# Patient Record
Sex: Female | Born: 1960 | Race: White | Hispanic: No | Marital: Married | Smoking: Never smoker
Health system: Southern US, Community
[De-identification: ages and names within clinical notes are randomized; demographics above are authoritative.]

## PROBLEM LIST (undated history)

## (undated) HISTORY — PX: NO PAST SURGERIES: SHX2092

## (undated) HISTORY — PX: TONSILECTOMY, ADENOIDECTOMY, BILATERAL MYRINGOTOMY AND TUBES: SHX2538

---

## 1998-02-25 ENCOUNTER — Other Ambulatory Visit: Admission: RE | Admit: 1998-02-25 | Discharge: 1998-02-25 | Payer: Self-pay | Admitting: Gynecology

## 1999-04-27 ENCOUNTER — Other Ambulatory Visit: Admission: RE | Admit: 1999-04-27 | Discharge: 1999-04-27 | Payer: Self-pay | Admitting: Gynecology

## 2001-01-03 ENCOUNTER — Other Ambulatory Visit: Admission: RE | Admit: 2001-01-03 | Discharge: 2001-01-03 | Payer: Self-pay | Admitting: Gynecology

## 2002-04-30 ENCOUNTER — Other Ambulatory Visit: Admission: RE | Admit: 2002-04-30 | Discharge: 2002-04-30 | Payer: Self-pay | Admitting: Gynecology

## 2003-06-20 ENCOUNTER — Other Ambulatory Visit: Admission: RE | Admit: 2003-06-20 | Discharge: 2003-06-20 | Payer: Self-pay | Admitting: Gynecology

## 2011-10-05 ENCOUNTER — Other Ambulatory Visit: Payer: Self-pay | Admitting: Gynecology

## 2011-10-05 DIAGNOSIS — R928 Other abnormal and inconclusive findings on diagnostic imaging of breast: Secondary | ICD-10-CM

## 2011-10-13 ENCOUNTER — Ambulatory Visit
Admission: RE | Admit: 2011-10-13 | Discharge: 2011-10-13 | Disposition: A | Discharge status: Home / Self Care | Payer: Federal, State, Local not specified - PPO | Source: Ambulatory Visit | Attending: Gynecology | Admitting: Gynecology

## 2011-10-13 DIAGNOSIS — R928 Other abnormal and inconclusive findings on diagnostic imaging of breast: Secondary | ICD-10-CM

## 2012-12-06 ENCOUNTER — Other Ambulatory Visit: Payer: Self-pay | Admitting: Gynecology

## 2012-12-06 DIAGNOSIS — N6489 Other specified disorders of breast: Secondary | ICD-10-CM

## 2012-12-19 ENCOUNTER — Ambulatory Visit
Admission: RE | Admit: 2012-12-19 | Discharge: 2012-12-19 | Disposition: A | Discharge status: Home / Self Care | Payer: Federal, State, Local not specified - PPO | Source: Ambulatory Visit | Attending: Gynecology | Admitting: Gynecology

## 2012-12-19 DIAGNOSIS — N6489 Other specified disorders of breast: Secondary | ICD-10-CM

## 2013-12-13 ENCOUNTER — Other Ambulatory Visit: Payer: Self-pay | Admitting: Gynecology

## 2013-12-13 DIAGNOSIS — N6489 Other specified disorders of breast: Secondary | ICD-10-CM

## 2013-12-28 ENCOUNTER — Ambulatory Visit
Admission: RE | Admit: 2013-12-28 | Discharge: 2013-12-28 | Disposition: A | Discharge status: Home / Self Care | Payer: 59 | Source: Ambulatory Visit | Attending: Gynecology | Admitting: Gynecology

## 2013-12-28 DIAGNOSIS — N6489 Other specified disorders of breast: Secondary | ICD-10-CM

## 2015-01-01 ENCOUNTER — Other Ambulatory Visit: Payer: Self-pay

## 2015-01-01 DIAGNOSIS — Z1231 Encounter for screening mammogram for malignant neoplasm of breast: Secondary | ICD-10-CM

## 2015-01-17 ENCOUNTER — Ambulatory Visit
Admission: RE | Admit: 2015-01-17 | Discharge: 2015-01-17 | Disposition: A | Discharge status: Home / Self Care | Payer: 59 | Source: Ambulatory Visit

## 2015-01-17 DIAGNOSIS — Z1231 Encounter for screening mammogram for malignant neoplasm of breast: Secondary | ICD-10-CM

## 2015-12-24 ENCOUNTER — Other Ambulatory Visit: Payer: Self-pay | Admitting: Obstetrics & Gynecology

## 2015-12-24 DIAGNOSIS — Z1231 Encounter for screening mammogram for malignant neoplasm of breast: Secondary | ICD-10-CM

## 2016-01-21 ENCOUNTER — Ambulatory Visit
Admission: RE | Admit: 2016-01-21 | Discharge: 2016-01-21 | Disposition: A | Discharge status: Home / Self Care | Payer: 59 | Source: Ambulatory Visit | Attending: Obstetrics & Gynecology | Admitting: Obstetrics & Gynecology

## 2016-01-21 ENCOUNTER — Encounter: Payer: Self-pay | Admitting: Radiology

## 2016-01-21 DIAGNOSIS — Z1231 Encounter for screening mammogram for malignant neoplasm of breast: Secondary | ICD-10-CM

## 2016-12-10 ENCOUNTER — Other Ambulatory Visit: Payer: Self-pay | Admitting: Obstetrics & Gynecology

## 2016-12-10 DIAGNOSIS — Z1231 Encounter for screening mammogram for malignant neoplasm of breast: Secondary | ICD-10-CM

## 2017-01-25 ENCOUNTER — Ambulatory Visit
Admission: RE | Admit: 2017-01-25 | Discharge: 2017-01-25 | Disposition: A | Discharge status: Home / Self Care | Payer: 59 | Source: Ambulatory Visit | Attending: Obstetrics & Gynecology | Admitting: Obstetrics & Gynecology

## 2017-01-25 DIAGNOSIS — Z1231 Encounter for screening mammogram for malignant neoplasm of breast: Secondary | ICD-10-CM

## 2017-11-30 ENCOUNTER — Other Ambulatory Visit: Payer: Self-pay | Admitting: Obstetrics & Gynecology

## 2017-11-30 DIAGNOSIS — Z1231 Encounter for screening mammogram for malignant neoplasm of breast: Secondary | ICD-10-CM

## 2018-01-31 ENCOUNTER — Ambulatory Visit
Admission: RE | Admit: 2018-01-31 | Discharge: 2018-01-31 | Disposition: A | Discharge status: Home / Self Care | Payer: Federal, State, Local not specified - PPO | Source: Ambulatory Visit | Attending: Obstetrics & Gynecology | Admitting: Obstetrics & Gynecology

## 2018-01-31 DIAGNOSIS — Z1231 Encounter for screening mammogram for malignant neoplasm of breast: Secondary | ICD-10-CM

## 2018-12-27 ENCOUNTER — Other Ambulatory Visit: Payer: Self-pay | Admitting: Obstetrics & Gynecology

## 2018-12-27 DIAGNOSIS — Z1231 Encounter for screening mammogram for malignant neoplasm of breast: Secondary | ICD-10-CM

## 2019-02-07 ENCOUNTER — Ambulatory Visit: Payer: Federal, State, Local not specified - PPO

## 2019-02-15 ENCOUNTER — Other Ambulatory Visit: Payer: Self-pay

## 2019-02-15 ENCOUNTER — Ambulatory Visit
Admission: RE | Admit: 2019-02-15 | Discharge: 2019-02-15 | Disposition: A | Discharge status: Home / Self Care | Payer: Federal, State, Local not specified - PPO | Source: Ambulatory Visit | Attending: Obstetrics & Gynecology | Admitting: Obstetrics & Gynecology

## 2019-02-15 DIAGNOSIS — Z1231 Encounter for screening mammogram for malignant neoplasm of breast: Secondary | ICD-10-CM

## 2019-11-26 DIAGNOSIS — M4125 Other idiopathic scoliosis, thoracolumbar region: Secondary | ICD-10-CM | POA: Diagnosis not present

## 2019-11-26 DIAGNOSIS — M545 Low back pain: Secondary | ICD-10-CM | POA: Diagnosis not present

## 2019-11-26 DIAGNOSIS — M62830 Muscle spasm of back: Secondary | ICD-10-CM | POA: Diagnosis not present

## 2019-11-26 DIAGNOSIS — M9903 Segmental and somatic dysfunction of lumbar region: Secondary | ICD-10-CM | POA: Diagnosis not present

## 2019-11-29 DIAGNOSIS — M62830 Muscle spasm of back: Secondary | ICD-10-CM | POA: Diagnosis not present

## 2019-11-29 DIAGNOSIS — M4125 Other idiopathic scoliosis, thoracolumbar region: Secondary | ICD-10-CM | POA: Diagnosis not present

## 2019-11-29 DIAGNOSIS — M9903 Segmental and somatic dysfunction of lumbar region: Secondary | ICD-10-CM | POA: Diagnosis not present

## 2019-11-29 DIAGNOSIS — M545 Low back pain: Secondary | ICD-10-CM | POA: Diagnosis not present

## 2019-12-03 DIAGNOSIS — M9903 Segmental and somatic dysfunction of lumbar region: Secondary | ICD-10-CM | POA: Diagnosis not present

## 2019-12-03 DIAGNOSIS — M4125 Other idiopathic scoliosis, thoracolumbar region: Secondary | ICD-10-CM | POA: Diagnosis not present

## 2019-12-03 DIAGNOSIS — M545 Low back pain: Secondary | ICD-10-CM | POA: Diagnosis not present

## 2019-12-03 DIAGNOSIS — M62830 Muscle spasm of back: Secondary | ICD-10-CM | POA: Diagnosis not present

## 2019-12-05 DIAGNOSIS — M9903 Segmental and somatic dysfunction of lumbar region: Secondary | ICD-10-CM | POA: Diagnosis not present

## 2019-12-05 DIAGNOSIS — M62830 Muscle spasm of back: Secondary | ICD-10-CM | POA: Diagnosis not present

## 2019-12-05 DIAGNOSIS — M4125 Other idiopathic scoliosis, thoracolumbar region: Secondary | ICD-10-CM | POA: Diagnosis not present

## 2019-12-05 DIAGNOSIS — M545 Low back pain: Secondary | ICD-10-CM | POA: Diagnosis not present

## 2019-12-06 DIAGNOSIS — M545 Low back pain: Secondary | ICD-10-CM | POA: Diagnosis not present

## 2019-12-06 DIAGNOSIS — M4125 Other idiopathic scoliosis, thoracolumbar region: Secondary | ICD-10-CM | POA: Diagnosis not present

## 2019-12-06 DIAGNOSIS — M9903 Segmental and somatic dysfunction of lumbar region: Secondary | ICD-10-CM | POA: Diagnosis not present

## 2019-12-06 DIAGNOSIS — M62830 Muscle spasm of back: Secondary | ICD-10-CM | POA: Diagnosis not present

## 2019-12-10 DIAGNOSIS — M9903 Segmental and somatic dysfunction of lumbar region: Secondary | ICD-10-CM | POA: Diagnosis not present

## 2019-12-10 DIAGNOSIS — M4125 Other idiopathic scoliosis, thoracolumbar region: Secondary | ICD-10-CM | POA: Diagnosis not present

## 2019-12-10 DIAGNOSIS — M545 Low back pain: Secondary | ICD-10-CM | POA: Diagnosis not present

## 2019-12-10 DIAGNOSIS — M62830 Muscle spasm of back: Secondary | ICD-10-CM | POA: Diagnosis not present

## 2019-12-12 DIAGNOSIS — M62830 Muscle spasm of back: Secondary | ICD-10-CM | POA: Diagnosis not present

## 2019-12-12 DIAGNOSIS — M4125 Other idiopathic scoliosis, thoracolumbar region: Secondary | ICD-10-CM | POA: Diagnosis not present

## 2019-12-12 DIAGNOSIS — M9903 Segmental and somatic dysfunction of lumbar region: Secondary | ICD-10-CM | POA: Diagnosis not present

## 2019-12-12 DIAGNOSIS — M545 Low back pain: Secondary | ICD-10-CM | POA: Diagnosis not present

## 2019-12-13 DIAGNOSIS — M9903 Segmental and somatic dysfunction of lumbar region: Secondary | ICD-10-CM | POA: Diagnosis not present

## 2019-12-13 DIAGNOSIS — M545 Low back pain: Secondary | ICD-10-CM | POA: Diagnosis not present

## 2019-12-13 DIAGNOSIS — M4125 Other idiopathic scoliosis, thoracolumbar region: Secondary | ICD-10-CM | POA: Diagnosis not present

## 2019-12-13 DIAGNOSIS — M62830 Muscle spasm of back: Secondary | ICD-10-CM | POA: Diagnosis not present

## 2019-12-17 DIAGNOSIS — M62830 Muscle spasm of back: Secondary | ICD-10-CM | POA: Diagnosis not present

## 2019-12-17 DIAGNOSIS — M4125 Other idiopathic scoliosis, thoracolumbar region: Secondary | ICD-10-CM | POA: Diagnosis not present

## 2019-12-17 DIAGNOSIS — M9903 Segmental and somatic dysfunction of lumbar region: Secondary | ICD-10-CM | POA: Diagnosis not present

## 2019-12-17 DIAGNOSIS — M545 Low back pain: Secondary | ICD-10-CM | POA: Diagnosis not present

## 2019-12-19 DIAGNOSIS — M9903 Segmental and somatic dysfunction of lumbar region: Secondary | ICD-10-CM | POA: Diagnosis not present

## 2019-12-19 DIAGNOSIS — M62830 Muscle spasm of back: Secondary | ICD-10-CM | POA: Diagnosis not present

## 2019-12-19 DIAGNOSIS — M4125 Other idiopathic scoliosis, thoracolumbar region: Secondary | ICD-10-CM | POA: Diagnosis not present

## 2019-12-19 DIAGNOSIS — M545 Low back pain: Secondary | ICD-10-CM | POA: Diagnosis not present

## 2019-12-24 DIAGNOSIS — M4125 Other idiopathic scoliosis, thoracolumbar region: Secondary | ICD-10-CM | POA: Diagnosis not present

## 2019-12-24 DIAGNOSIS — M545 Low back pain: Secondary | ICD-10-CM | POA: Diagnosis not present

## 2019-12-24 DIAGNOSIS — M62830 Muscle spasm of back: Secondary | ICD-10-CM | POA: Diagnosis not present

## 2019-12-24 DIAGNOSIS — M9903 Segmental and somatic dysfunction of lumbar region: Secondary | ICD-10-CM | POA: Diagnosis not present

## 2019-12-26 DIAGNOSIS — M9903 Segmental and somatic dysfunction of lumbar region: Secondary | ICD-10-CM | POA: Diagnosis not present

## 2019-12-26 DIAGNOSIS — M62830 Muscle spasm of back: Secondary | ICD-10-CM | POA: Diagnosis not present

## 2019-12-26 DIAGNOSIS — M545 Low back pain: Secondary | ICD-10-CM | POA: Diagnosis not present

## 2019-12-26 DIAGNOSIS — M4125 Other idiopathic scoliosis, thoracolumbar region: Secondary | ICD-10-CM | POA: Diagnosis not present

## 2019-12-31 ENCOUNTER — Other Ambulatory Visit: Payer: Self-pay | Admitting: Obstetrics & Gynecology

## 2019-12-31 DIAGNOSIS — Z1231 Encounter for screening mammogram for malignant neoplasm of breast: Secondary | ICD-10-CM

## 2020-01-02 DIAGNOSIS — M4125 Other idiopathic scoliosis, thoracolumbar region: Secondary | ICD-10-CM | POA: Diagnosis not present

## 2020-01-02 DIAGNOSIS — M62830 Muscle spasm of back: Secondary | ICD-10-CM | POA: Diagnosis not present

## 2020-01-02 DIAGNOSIS — M545 Low back pain: Secondary | ICD-10-CM | POA: Diagnosis not present

## 2020-01-02 DIAGNOSIS — M9903 Segmental and somatic dysfunction of lumbar region: Secondary | ICD-10-CM | POA: Diagnosis not present

## 2020-01-10 DIAGNOSIS — M62830 Muscle spasm of back: Secondary | ICD-10-CM | POA: Diagnosis not present

## 2020-01-10 DIAGNOSIS — M4125 Other idiopathic scoliosis, thoracolumbar region: Secondary | ICD-10-CM | POA: Diagnosis not present

## 2020-01-10 DIAGNOSIS — M545 Low back pain: Secondary | ICD-10-CM | POA: Diagnosis not present

## 2020-01-10 DIAGNOSIS — M9903 Segmental and somatic dysfunction of lumbar region: Secondary | ICD-10-CM | POA: Diagnosis not present

## 2020-01-23 DIAGNOSIS — M4125 Other idiopathic scoliosis, thoracolumbar region: Secondary | ICD-10-CM | POA: Diagnosis not present

## 2020-01-23 DIAGNOSIS — M62830 Muscle spasm of back: Secondary | ICD-10-CM | POA: Diagnosis not present

## 2020-01-23 DIAGNOSIS — M9903 Segmental and somatic dysfunction of lumbar region: Secondary | ICD-10-CM | POA: Diagnosis not present

## 2020-01-23 DIAGNOSIS — M545 Low back pain: Secondary | ICD-10-CM | POA: Diagnosis not present

## 2020-02-06 DIAGNOSIS — M62830 Muscle spasm of back: Secondary | ICD-10-CM | POA: Diagnosis not present

## 2020-02-06 DIAGNOSIS — M545 Low back pain: Secondary | ICD-10-CM | POA: Diagnosis not present

## 2020-02-06 DIAGNOSIS — M9903 Segmental and somatic dysfunction of lumbar region: Secondary | ICD-10-CM | POA: Diagnosis not present

## 2020-02-06 DIAGNOSIS — M4125 Other idiopathic scoliosis, thoracolumbar region: Secondary | ICD-10-CM | POA: Diagnosis not present

## 2020-02-18 ENCOUNTER — Ambulatory Visit
Admission: RE | Admit: 2020-02-18 | Discharge: 2020-02-18 | Disposition: A | Discharge status: Home / Self Care | Payer: Federal, State, Local not specified - PPO | Source: Ambulatory Visit | Attending: Obstetrics & Gynecology | Admitting: Obstetrics & Gynecology

## 2020-02-18 ENCOUNTER — Other Ambulatory Visit: Payer: Self-pay

## 2020-02-18 DIAGNOSIS — Z01419 Encounter for gynecological examination (general) (routine) without abnormal findings: Secondary | ICD-10-CM | POA: Diagnosis not present

## 2020-02-18 DIAGNOSIS — Z0000 Encounter for general adult medical examination without abnormal findings: Secondary | ICD-10-CM | POA: Diagnosis not present

## 2020-02-18 DIAGNOSIS — Z1231 Encounter for screening mammogram for malignant neoplasm of breast: Secondary | ICD-10-CM

## 2020-02-18 DIAGNOSIS — Z6825 Body mass index (BMI) 25.0-25.9, adult: Secondary | ICD-10-CM | POA: Diagnosis not present

## 2020-02-18 DIAGNOSIS — Z124 Encounter for screening for malignant neoplasm of cervix: Secondary | ICD-10-CM | POA: Diagnosis not present

## 2020-02-27 DIAGNOSIS — M9903 Segmental and somatic dysfunction of lumbar region: Secondary | ICD-10-CM | POA: Diagnosis not present

## 2020-02-27 DIAGNOSIS — M62830 Muscle spasm of back: Secondary | ICD-10-CM | POA: Diagnosis not present

## 2020-02-27 DIAGNOSIS — M4125 Other idiopathic scoliosis, thoracolumbar region: Secondary | ICD-10-CM | POA: Diagnosis not present

## 2020-02-27 DIAGNOSIS — M5136 Other intervertebral disc degeneration, lumbar region: Secondary | ICD-10-CM | POA: Diagnosis not present

## 2020-04-02 DIAGNOSIS — M9903 Segmental and somatic dysfunction of lumbar region: Secondary | ICD-10-CM | POA: Diagnosis not present

## 2020-04-02 DIAGNOSIS — M5136 Other intervertebral disc degeneration, lumbar region: Secondary | ICD-10-CM | POA: Diagnosis not present

## 2020-04-02 DIAGNOSIS — M62830 Muscle spasm of back: Secondary | ICD-10-CM | POA: Diagnosis not present

## 2020-04-02 DIAGNOSIS — M4125 Other idiopathic scoliosis, thoracolumbar region: Secondary | ICD-10-CM | POA: Diagnosis not present

## 2020-04-30 DIAGNOSIS — M9905 Segmental and somatic dysfunction of pelvic region: Secondary | ICD-10-CM | POA: Diagnosis not present

## 2020-04-30 DIAGNOSIS — M4125 Other idiopathic scoliosis, thoracolumbar region: Secondary | ICD-10-CM | POA: Diagnosis not present

## 2020-04-30 DIAGNOSIS — M5136 Other intervertebral disc degeneration, lumbar region: Secondary | ICD-10-CM | POA: Diagnosis not present

## 2020-04-30 DIAGNOSIS — M9903 Segmental and somatic dysfunction of lumbar region: Secondary | ICD-10-CM | POA: Diagnosis not present

## 2021-01-12 ENCOUNTER — Other Ambulatory Visit: Payer: Self-pay | Admitting: Obstetrics & Gynecology

## 2021-01-12 DIAGNOSIS — Z1231 Encounter for screening mammogram for malignant neoplasm of breast: Secondary | ICD-10-CM

## 2021-02-23 ENCOUNTER — Other Ambulatory Visit: Payer: Self-pay

## 2021-02-23 ENCOUNTER — Ambulatory Visit
Admission: RE | Admit: 2021-02-23 | Discharge: 2021-02-23 | Disposition: A | Discharge status: Home / Self Care | Payer: Federal, State, Local not specified - PPO | Source: Ambulatory Visit | Attending: Obstetrics & Gynecology | Admitting: Obstetrics & Gynecology

## 2021-02-23 DIAGNOSIS — Z0000 Encounter for general adult medical examination without abnormal findings: Secondary | ICD-10-CM | POA: Diagnosis not present

## 2021-02-23 DIAGNOSIS — Z01419 Encounter for gynecological examination (general) (routine) without abnormal findings: Secondary | ICD-10-CM | POA: Diagnosis not present

## 2021-02-23 DIAGNOSIS — Z1231 Encounter for screening mammogram for malignant neoplasm of breast: Secondary | ICD-10-CM | POA: Diagnosis not present

## 2021-03-31 ENCOUNTER — Encounter: Payer: Self-pay | Admitting: *Deleted

## 2021-03-31 ENCOUNTER — Encounter: Payer: Self-pay | Admitting: Cardiology

## 2021-03-31 DIAGNOSIS — R5383 Other fatigue: Secondary | ICD-10-CM

## 2021-03-31 DIAGNOSIS — E7801 Familial hypercholesterolemia: Secondary | ICD-10-CM | POA: Insufficient documentation

## 2021-03-31 DIAGNOSIS — N951 Menopausal and female climacteric states: Secondary | ICD-10-CM | POA: Insufficient documentation

## 2021-03-31 DIAGNOSIS — R5381 Other malaise: Secondary | ICD-10-CM

## 2021-03-31 DIAGNOSIS — E7800 Pure hypercholesterolemia, unspecified: Secondary | ICD-10-CM

## 2021-03-31 DIAGNOSIS — IMO0002 Reserved for concepts with insufficient information to code with codable children: Secondary | ICD-10-CM | POA: Insufficient documentation

## 2021-03-31 HISTORY — DX: Pure hypercholesterolemia, unspecified: E78.00

## 2021-03-31 HISTORY — DX: Familial hypercholesterolemia: E78.01

## 2021-03-31 HISTORY — DX: Other fatigue: R53.83

## 2021-03-31 HISTORY — DX: Menopausal and female climacteric states: N95.1

## 2021-03-31 HISTORY — DX: Other fatigue: R53.81

## 2021-04-03 ENCOUNTER — Ambulatory Visit (INDEPENDENT_AMBULATORY_CARE_PROVIDER_SITE_OTHER): Payer: Federal, State, Local not specified - PPO

## 2021-04-03 ENCOUNTER — Encounter: Payer: Self-pay | Admitting: Cardiology

## 2021-04-03 ENCOUNTER — Ambulatory Visit: Payer: Federal, State, Local not specified - PPO | Admitting: Cardiology

## 2021-04-03 ENCOUNTER — Other Ambulatory Visit: Payer: Self-pay

## 2021-04-03 VITALS — BP 144/84 | HR 58 | Ht 65.5 in | Wt 160.8 lb

## 2021-04-03 DIAGNOSIS — E785 Hyperlipidemia, unspecified: Secondary | ICD-10-CM

## 2021-04-03 DIAGNOSIS — R002 Palpitations: Secondary | ICD-10-CM | POA: Diagnosis not present

## 2021-04-03 DIAGNOSIS — R0609 Other forms of dyspnea: Secondary | ICD-10-CM | POA: Diagnosis not present

## 2021-04-03 NOTE — Progress Notes (Signed)
Cardiology Consultation:    Date:  04/03/2021   ID:  Melissa Marks, DOB [birth year], MRN [id number]  PCP:  Melissa Helling, MD  Cardiologist:  Melissa Balsam, MD   Referring MD: Melissa Helling, MD   Chief Complaint  Patient presents with   Palpitations    History of Present Illness:    Melissa Marks is a 60 y.o. female who is being seen today for the evaluation of palpitations at the request of Melissa Helling, MD. with past medical history significant for dyslipidemia, dyspnea on exertion.  She came to [place] because of palpitations.  She said for years she will have abrupt onset abrupt offset palpitations that make her feel weak.  Sometimes she had to sit down when she got this palpitations however never passed out because of this.  She does have any chest pain tightness squeezing pressure burning Sensation with this sensation.  There is no shortness of breath.  She said that sensation lasted up to 5 minutes.  She cannot tell me if it is regular or irregular.  She cannot tell me also what triggers those palpitations. Overall she is doing quite well she works as a Research officer, political party she is very active she has to carry things and that she has no difficulty doing it.  Denies having any chest pain tightness squeezing pressure in the chest when he does not but she gets some shortness of breath with exertion. She never smoked Does have family history of multiple family members with premature coronary artery disease I did review her K PN which show me her LDL of 113 HDL 81 this is from October 2021 however. She does not exercise on the regular basis, she is not on any special diet  Past Medical History:  Diagnosis Date   Familial hypercholesterolemia 03/31/2021   Fatigue 03/31/2021   Malaise and fatigue 03/31/2021   Menopausal syndrome 03/31/2021   Pure hypercholesterolemia 03/31/2021    Past Surgical History:  Procedure Laterality Date   TONSILECTOMY, ADENOIDECTOMY, BILATERAL MYRINGOTOMY AND TUBES       Current Medications: Current Meds  Medication Sig   calcium carbonate (TUMS - DOSED IN MG ELEMENTAL CALCIUM) 500 MG chewable tablet Chew 1 tablet by mouth every other day.     Allergies:   Patient has no known allergies.   Social History   Socioeconomic History   Marital status: Married    Spouse name: Not on file   Number of children: Not on file   Years of education: Not on file   Highest education level: Not on file  Occupational History   Not on file  Tobacco Use   Smoking status: Never   Smokeless tobacco: Not on file  Substance and Sexual Activity   Alcohol use: Never   Drug use: Never   Sexual activity: Not on file  Other Topics Concern   Not on file  Social History Narrative   Not on file   Social Determinants of Health   Financial Resource Strain: Not on file  Food Insecurity: Not on file  Transportation Needs: Not on file  Physical Activity: Not on file  Stress: Not on file  Social Connections: Not on file     Family History: The patient's family history includes Aneurysm in her father; Heart disease in her father, maternal grandfather, and mother; Heart failure in her mother; Ovarian cancer in her mother. There is no history of Breast cancer. ROS:   Please see the history of present illness.  All 14 point review of systems negative except as described per history of present illness.  EKGs/Labs/Other Studies Reviewed:    The following studies were reviewed today:   EKG:  EKG is  ordered today.  The ekg ordered today demonstrates normal sinus rhythm normal P interval normal QS complex duration morphology no ST segment changes  Recent Labs: No results found for requested labs within last 8760 hours.  Recent Lipid Panel No results found for: CHOL, TRIG, HDL, CHOLHDL, VLDL, LDLCALC, LDLDIRECT  Physical Exam:    VS:  BP (!) 144/84 (BP Location: Left Arm, Patient Position: Sitting)   Pulse (!) 58   Ht 5' 5.5" (1.664 m)   Wt 160 lb 12.8 oz  (72.9 kg)   SpO2 97%   BMI 26.35 kg/m     Wt Readings from Last 3 Encounters:  04/03/21 160 lb 12.8 oz (72.9 kg)  02/23/21 157 lb (71.2 kg)     GEN:  Well nourished, well developed in no acute distress HEENT: Normal NECK: No JVD; No carotid bruits LYMPHATICS: No lymphadenopathy CARDIAC: RRR, no murmurs, no rubs, no gallops RESPIRATORY:  Clear to auscultation without rales, wheezing or rhonchi  ABDOMEN: Soft, non-tender, non-distended MUSCULOSKELETAL:  No edema; No deformity  SKIN: Warm and dry NEUROLOGIC:  Alert and oriented x 3 PSYCHIATRIC:  Normal affect   ASSESSMENT:    1. Palpitations   2. Dyspnea on exertion   3. Dyslipidemia    PLAN:    In order of problems listed above:  Palpitations.  I will ask her to wear Zio patch for 2 weeks to see if she got any significant arrhythmia.  I will not start any antiarrhythmic medication until I know exactly what is going on.  If we will not identified a problem then she needs to purchase either cardia apple watch to see if we can catch arrhythmia.  As a part of evaluation she will have an echocardiogram done to assess left ventricle ejection fraction. Dyspnea on exertion: Echocardiogram will be performed. Dyslipidemia her cholesterol is quite decent.  But this data from a year ago.  We will try to get another copy of her most recent fasting lipid profile.     Medication Adjustments/Labs and Tests Ordered: Current medicines are reviewed at length with the patient today.  Concerns regarding medicines are outlined above.  No orders of the defined types were placed in this encounter.  No orders of the defined types were placed in this encounter.   Signed, Georgeanna Lea, MD, [facility]. 04/03/2021 3:26 PM    [facility]

## 2021-04-03 NOTE — Patient Instructions (Signed)
Medication Instructions:  Your physician recommends that you continue on your current medications as directed. Please refer to the Current Medication list given to you today.  *If you need a refill on your cardiac medications before your next appointment, please call your pharmacy*   Lab Work: None. If you have labs (blood work) drawn today and your tests are completely normal, you will receive your results only by: MyChart Message (if you have MyChart) OR A paper copy in the mail If you have any lab test that is abnormal or we need to change your treatment, we will call you to review the results.   Testing/Procedures: Your physician has requested that you have an echocardiogram. Echocardiography is a painless test that uses sound waves to create images of your heart. It provides your doctor with information about the size and shape of your heart and how well your heart's chambers and valves are working. This procedure takes approximately one hour. There are no restrictions for this procedure.  A zio monitor was ordered today. It will remain on for 14 days. You will then return monitor and event diary in provided box. It takes 1-2 weeks for report to be downloaded and returned to us. We will call you with the results. If monitor falls off or has orange flashing light, please call Zio for further instructions.     Follow-Up: At [facility], you and your health needs are our priority.  As part of our continuing mission to provide you with exceptional heart care, we have created designated Provider Care Teams.  These Care Teams include your primary Cardiologist (physician) and Advanced Practice Providers (APPs -  Physician Assistants and Nurse Practitioners) who all work together to provide you with the care you need, when you need it.  We recommend signing up for the patient portal called "MyChart".  Sign up information is provided on this After Visit Summary.  MyChart is used to connect  with patients for Virtual Visits (Telemedicine).  Patients are able to view lab/test results, encounter notes, upcoming appointments, etc.  Non-urgent messages can be sent to your provider as well.   To learn more about what you can do with MyChart, go to [email or web address].    Your next appointment:   3 month(s)  The format for your next appointment:   In Person  Provider:   Robert Krasowski, MD    Other Instructions  Echocardiogram An echocardiogram is a test that uses sound waves (ultrasound) to produce images of the heart. Images from an echocardiogram can provide important information about: Heart size and shape. The size and thickness and movement of your heart's walls. Heart muscle function and strength. Heart valve function or if you have stenosis. Stenosis is when the heart valves are too narrow. If blood is flowing backward through the heart valves (regurgitation). A tumor or infectious growth around the heart valves. Areas of heart muscle that are not working well because of poor blood flow or injury from a heart attack. Aneurysm detection. An aneurysm is a weak or damaged part of an artery wall. The wall bulges out from the normal force of blood pumping through the body. Tell a health care provider about: Any allergies you have. All medicines you are taking, including vitamins, herbs, eye drops, creams, and over-the-counter medicines. Any blood disorders you have. Any surgeries you have had. Any medical conditions you have. Whether you are pregnant or may be pregnant. What are the risks? Generally, this is a safe test. However,   problems may occur, including an allergic reaction to dye (contrast) that may be used during the test. What happens before the test? No specific preparation is needed. You may eat and drink normally. What happens during the test?  You will take off your clothes from the waist up and put on a hospital gown. Electrodes or  electrocardiogram (ECG)patches may be placed on your chest. The electrodes or patches are then connected to a device that monitors your heart rate and rhythm. You will lie down on a table for an ultrasound exam. A gel will be applied to your chest to help sound waves pass through your skin. A handheld device, called a transducer, will be pressed against your chest and moved over your heart. The transducer produces sound waves that travel to your heart and bounce back (or "echo" back) to the transducer. These sound waves will be captured in real-time and changed into images of your heart that can be viewed on a video monitor. The images will be recorded on a computer and reviewed by your health care provider. You may be asked to change positions or hold your breath for a short time. This makes it easier to get different views or better views of your heart. In some cases, you may receive contrast through an IV in one of your veins. This can improve the quality of the pictures from your heart. The procedure may vary among health care providers and hospitals. What can I expect after the test? You may return to your normal, everyday life, including diet, activities, and medicines, unless your health care provider tells you not to do that. Follow these instructions at home: It is up to you to get the results of your test. Ask your health care provider, or the department that is doing the test, when your results will be ready. Keep all follow-up visits. This is important. Summary An echocardiogram is a test that uses sound waves (ultrasound) to produce images of the heart. Images from an echocardiogram can provide important information about the size and shape of your heart, heart muscle function, heart valve function, and other possible heart problems. You do not need to do anything to prepare before this test. You may eat and drink normally. After the echocardiogram is completed, you may return to your  normal, everyday life, unless your health care provider tells you not to do that. This information is not intended to replace advice given to you by your health care provider. Make sure you discuss any questions you have with your health care provider. Document Revised: 01/14/2021 Document Reviewed: 12/25/2019 Elsevier Patient Education  2022 Elsevier Inc.   

## 2021-04-22 DIAGNOSIS — Z1211 Encounter for screening for malignant neoplasm of colon: Secondary | ICD-10-CM | POA: Diagnosis not present

## 2021-04-24 ENCOUNTER — Ambulatory Visit (INDEPENDENT_AMBULATORY_CARE_PROVIDER_SITE_OTHER): Payer: Federal, State, Local not specified - PPO

## 2021-04-24 ENCOUNTER — Other Ambulatory Visit: Payer: Self-pay

## 2021-04-24 DIAGNOSIS — R002 Palpitations: Secondary | ICD-10-CM

## 2021-04-24 DIAGNOSIS — E785 Hyperlipidemia, unspecified: Secondary | ICD-10-CM | POA: Diagnosis not present

## 2021-04-24 DIAGNOSIS — R0609 Other forms of dyspnea: Secondary | ICD-10-CM | POA: Diagnosis not present

## 2021-04-26 LAB — ECHOCARDIOGRAM COMPLETE
Area-P 1/2: 2.75 cm2
S' Lateral: 3 cm

## 2021-04-28 ENCOUNTER — Telehealth: Payer: Self-pay | Admitting: Cardiology

## 2021-04-28 NOTE — Telephone Encounter (Signed)
Patient informed of results.  

## 2021-04-28 NOTE — Telephone Encounter (Signed)
Patient returning call for echo results. 

## 2021-05-12 MED ORDER — METOPROLOL TARTRATE 25 MG PO TABS: 25.0000 mg | ORAL_TABLET | Freq: Two times a day (BID) | ORAL | 3 refills | Status: DC

## 2021-05-13 ENCOUNTER — Telehealth: Payer: Self-pay | Admitting: Cardiology

## 2021-05-13 NOTE — Telephone Encounter (Signed)
A user error has taken place: encounter opened in error, closed for administrative reasons.

## 2021-05-21 ENCOUNTER — Telehealth: Payer: Self-pay | Admitting: Cardiology

## 2021-05-21 NOTE — Telephone Encounter (Signed)
Pt c/o medication issue:  1. Name of Medication: metoprolol tartrate (LOPRESSOR) 25 MG tablet  2. How are you currently taking this medication (dosage and times per day)? Take 1 tablet (25 mg total) by mouth 2 (two) times daily.  3. Are you having a reaction (difficulty breathing--STAT)? no  4. What is your medication issue? With the medication, patient states she is still having palpitation

## 2021-05-21 NOTE — Telephone Encounter (Signed)
Spoke to patient. She has been taking metoprolol tartrate 25 mg twice daily since this past Saturday she is still having palpitations and wants to know what Dr. Bing Matter recommends.

## 2021-05-22 NOTE — Telephone Encounter (Signed)
Pt is returning call back to Roxanne Mins, RN or Triage

## 2021-05-22 NOTE — Telephone Encounter (Signed)
Left message for patient to return call.

## 2021-05-22 NOTE — Telephone Encounter (Signed)
Per Dr Bing Matter, Please find out if this medication helps some on not at all or did not make worse if it helps but may be not enough if this is the case then I recommend to take 1-1/2 tablet twice daily   Spoke with pt who reports she feels palpitations have increased and gotten worse since starting medication.  This is her 6th day taking the Metoprolol.  Pt advised will forward information to Dr Bing Matter for review and further recommendation.

## 2021-05-26 NOTE — Telephone Encounter (Signed)
Spoke to the patient just now and let her know Dr. Krasowski's recommendations. She verbalizes understanding.    Encouraged patient to call back with any questions or concerns.  

## 2021-06-23 DIAGNOSIS — R0981 Nasal congestion: Secondary | ICD-10-CM | POA: Diagnosis not present

## 2021-06-23 DIAGNOSIS — R051 Acute cough: Secondary | ICD-10-CM | POA: Diagnosis not present

## 2021-06-23 DIAGNOSIS — Z20828 Contact with and (suspected) exposure to other viral communicable diseases: Secondary | ICD-10-CM | POA: Diagnosis not present

## 2021-06-23 DIAGNOSIS — R509 Fever, unspecified: Secondary | ICD-10-CM | POA: Diagnosis not present

## 2021-06-23 DIAGNOSIS — M791 Myalgia, unspecified site: Secondary | ICD-10-CM | POA: Diagnosis not present

## 2021-07-16 ENCOUNTER — Other Ambulatory Visit: Payer: Self-pay

## 2021-07-16 ENCOUNTER — Ambulatory Visit: Payer: Federal, State, Local not specified - PPO | Admitting: Cardiology

## 2021-07-16 ENCOUNTER — Encounter: Payer: Self-pay | Admitting: Cardiology

## 2021-07-16 VITALS — BP 140/80 | HR 61 | Ht 66.0 in | Wt 156.8 lb

## 2021-07-16 DIAGNOSIS — R0609 Other forms of dyspnea: Secondary | ICD-10-CM | POA: Diagnosis not present

## 2021-07-16 DIAGNOSIS — I472 Ventricular tachycardia, unspecified: Secondary | ICD-10-CM

## 2021-07-16 DIAGNOSIS — E7800 Pure hypercholesterolemia, unspecified: Secondary | ICD-10-CM | POA: Diagnosis not present

## 2021-07-16 DIAGNOSIS — R002 Palpitations: Secondary | ICD-10-CM

## 2021-07-16 NOTE — Addendum Note (Signed)
Addended by: Heywood Bene on: 07/16/2021 03:50 PM ? ? Modules accepted: Orders ? ?

## 2021-07-16 NOTE — Progress Notes (Signed)
?Cardiology Office Note:   ? ?Date:  07/16/2021  ? ?ID:  [name], DOB 1961-03-11, MRN [id number] ? ?PCP:  Melissa Rea, MD  ?Cardiologist:  Jenne Campus, MD   ? ?Referring MD: Melissa Rea, MD  ? ?Chief Complaint  ?Patient presents with  ? Follow-up  ?  D/c Metoprolol, doing well  ? ? ?History of Present Illness:   ? ?Melissa Marks is a 61 y.o. female who was referred to [place] because of palpitations.  She did wear a monitor which showed some surprisingly nonsustained ventricular tachycardia, 12 beats at rate of 166, she was completely asymptomatic.  He did have some PVCs and APCs, 3 events however show only sinus rhythm.  Echocardiogram was done which showed preserved left ventricle ejection fraction without significant valvular pathology. ?She comes today to my office discuss those findings.  When I discovered ventricular tachycardia ask her to start taking metoprolol but she was unable to tolerate it she said when she started taking it her palpitation became much worse.  She never passed out she does not get dizzy she feels palpitation usually in form of skipped beats.  Ventricular tachycardia reported on the monitor was noted followed by her.  She is able to walk climb stairs with no difficulty.  Denies having any typical ischemic symptoms. ? ?Past Medical History:  ?Diagnosis Date  ? Familial hypercholesterolemia 03/31/2021  ? Fatigue 03/31/2021  ? Malaise and fatigue 03/31/2021  ? Menopausal syndrome 03/31/2021  ? Pure hypercholesterolemia 03/31/2021  ? ? ?Past Surgical History:  ?Procedure Laterality Date  ? TONSILECTOMY, ADENOIDECTOMY, BILATERAL MYRINGOTOMY AND TUBES    ? ? ?Current Medications: ?Current Meds  ?Medication Sig  ? calcium carbonate (TUMS - DOSED IN MG ELEMENTAL CALCIUM) 500 MG chewable tablet Chew 1 tablet by mouth every other day.  ? Multiple Vitamin (MULTIVITAMIN) tablet Take 1 tablet by mouth daily. Unknown strength  ?  ? ?Allergies:   Patient has no known allergies.  ? ?Social History   ? ?Socioeconomic History  ? Marital status: Married  ?  Spouse name: Not on file  ? Number of children: Not on file  ? Years of education: Not on file  ? Highest education level: Not on file  ?Occupational History  ? Not on file  ?Tobacco Use  ? Smoking status: Never  ? Smokeless tobacco: Not on file  ?Substance and Sexual Activity  ? Alcohol use: Never  ? Drug use: Never  ? Sexual activity: Not on file  ?Other Topics Concern  ? Not on file  ?Social History Narrative  ? Not on file  ? ?Social Determinants of Health  ? ?Financial Resource Strain: Not on file  ?Food Insecurity: Not on file  ?Transportation Needs: Not on file  ?Physical Activity: Not on file  ?Stress: Not on file  ?Social Connections: Not on file  ?  ? ?Family History: ?The patient's family history includes Aneurysm in her father; Heart disease in her father, maternal grandfather, and mother; Heart failure in her mother; Ovarian cancer in her mother. There is no history of Breast cancer. ?ROS:   ?Please see the history of present illness.    ?All 14 point review of systems negative except as described per history of present illness ? ?EKGs/Labs/Other Studies Reviewed:   ? ? ? ?Recent Labs: ?No results found for requested labs within last 8760 hours.  ?Recent Lipid Panel ?No results found for: CHOL, TRIG, HDL, CHOLHDL, VLDL, LDLCALC, LDLDIRECT ? ?Physical Exam:   ? ?  VS:  BP 140/80 (BP Location: Right Arm, Patient Position: Sitting)   Pulse 61   Ht 5\' 6"  (1.676 m)   Wt 156 lb 12.8 oz (71.1 kg)   SpO2 99%   BMI 25.31 kg/m?    ? ?Wt Readings from Last 3 Encounters:  ?07/16/21 156 lb 12.8 oz (71.1 kg)  ?04/03/21 160 lb 12.8 oz (72.9 kg)  ?02/23/21 157 lb (71.2 kg)  ?  ? ?GEN:  Well nourished, well developed in no acute distress ?HEENT: Normal ?NECK: No JVD; No carotid bruits ?LYMPHATICS: No lymphadenopathy ?CARDIAC: RRR, no murmurs, no rubs, no gallops ?RESPIRATORY:  Clear to auscultation without rales, wheezing or rhonchi  ?ABDOMEN: Soft, non-tender,  non-distended ?MUSCULOSKELETAL:  No edema; No deformity  ?SKIN: Warm and dry ?LOWER EXTREMITIES: no swelling ?NEUROLOGIC:  Alert and oriented x 3 ?PSYCHIATRIC:  Normal affect  ? ?ASSESSMENT:   ? ?1. Dyspnea on exertion   ?2. Ventricular tachycardia   ?3. Palpitations   ?4. Pure hypercholesterolemia   ? ?PLAN:   ? ?In order of problems listed above: ? ?Ventricle tachycardia.  Echocardiogram preserved ejection fraction, she is unable to tolerate beta-blocker.  Therefore, I asked her to do a calcium score to trying to figure out if there is transfer her to have some coronary artery disease.  She has absolutely no symptomatology suggesting that however she got borderline cholesterol and that will help determine if we need to be more aggressive in management of this problem as well. ?Dyspnea exertion.  Denies having any.  Doing well from that point review. ?Essential hypertension blood pressure elevated today but only mildly.  We will continue monitoring.  Concern is the fact that she does have already mild left ventricle hypertrophy.  I wanted to initiate some medication for arrhythmia as well as blood pressure she is somewhat reluctant. ?Dyslipidemia I did review K PN which show me LDL of 111 HDL 78.  Plan is to do a calcium score to decide aggressiveness of therapy. ? ? ?Medication Adjustments/Labs and Tests Ordered: ?Current medicines are reviewed at length with the patient today.  Concerns regarding medicines are outlined above.  ?No orders of the defined types were placed in this encounter. ? ?Medication changes: No orders of the defined types were placed in this encounter. ? ? ?Signed, ?Park Liter, MD, [facility] ?07/16/2021 3:40 PM    ?[place] ?

## 2021-07-16 NOTE — Patient Instructions (Signed)
Medication Instructions:  ?Your physician recommends that you continue on your current medications as directed. Please refer to the Current Medication list given to you today. ? ?*If you need a refill on your cardiac medications before your next appointment, please call your pharmacy* ? ? ?Lab Work: ?None ?If you have labs (blood work) drawn today and your tests are completely normal, you will receive your results only by: ?MyChart Message (if you have MyChart) OR ?A paper copy in the mail ?If you have any lab test that is abnormal or we need to change your treatment, we will call you to review the results. ? ? ?Testing/Procedures: ?We will order CT coronary calcium score. ?It will cost $99.00 and is not covered by insurance.  ?Please call [phone] to schedule.   ?[facility]  ?[address]  ?[place], [place] [address]  ? ? ?Follow-Up: ?At [facility], you and your health needs are our priority.  As part of our continuing mission to provide you with exceptional heart care, we have created designated Provider Care Teams.  These Care Teams include your primary Cardiologist (physician) and Advanced Practice Providers (APPs -  Physician Assistants and Nurse Practitioners) who all work together to provide you with the care you need, when you need it. ? ?We recommend signing up for the patient portal called "MyChart".  Sign up information is provided on this After Visit Summary.  MyChart is used to connect with patients for Virtual Visits (Telemedicine).  Patients are able to view lab/test results, encounter notes, upcoming appointments, etc.  Non-urgent messages can be sent to your provider as well.   ?To learn more about what you can do with MyChart, go to [email or web address].   ? ?Your next appointment:   ?6 month(s) ? ?The format for your next appointment:   ?In Person ? ?Provider:   ?Gypsy Balsam, MD  ? ? ?Other Instructions ?  ?

## 2021-07-24 DIAGNOSIS — M25552 Pain in left hip: Secondary | ICD-10-CM | POA: Diagnosis not present

## 2021-09-04 ENCOUNTER — Ambulatory Visit (INDEPENDENT_AMBULATORY_CARE_PROVIDER_SITE_OTHER)
Admission: RE | Admit: 2021-09-04 | Discharge: 2021-09-04 | Disposition: A | Discharge status: Home / Self Care | Payer: Self-pay | Source: Ambulatory Visit | Attending: Cardiology | Admitting: Cardiology

## 2021-09-04 DIAGNOSIS — E7800 Pure hypercholesterolemia, unspecified: Secondary | ICD-10-CM

## 2021-09-11 NOTE — Progress Notes (Signed)
Unable to reach the patient. Results mailed to the patient  ?

## 2021-12-28 ENCOUNTER — Other Ambulatory Visit: Payer: Self-pay | Admitting: Obstetrics & Gynecology

## 2021-12-28 DIAGNOSIS — Z1231 Encounter for screening mammogram for malignant neoplasm of breast: Secondary | ICD-10-CM

## 2022-01-25 ENCOUNTER — Encounter: Payer: Self-pay | Admitting: Cardiology

## 2022-01-25 ENCOUNTER — Ambulatory Visit: Payer: Federal, State, Local not specified - PPO | Attending: Cardiology | Admitting: Cardiology

## 2022-01-25 VITALS — BP 138/78 | HR 63 | Ht 65.5 in | Wt 158.6 lb

## 2022-01-25 DIAGNOSIS — R002 Palpitations: Secondary | ICD-10-CM | POA: Diagnosis not present

## 2022-01-25 DIAGNOSIS — R0609 Other forms of dyspnea: Secondary | ICD-10-CM | POA: Diagnosis not present

## 2022-01-25 DIAGNOSIS — I472 Ventricular tachycardia, unspecified: Secondary | ICD-10-CM | POA: Diagnosis not present

## 2022-01-25 DIAGNOSIS — E785 Hyperlipidemia, unspecified: Secondary | ICD-10-CM

## 2022-01-25 NOTE — Patient Instructions (Signed)
Medication Instructions:  Your physician recommends that you continue on your current medications as directed. Please refer to the Current Medication list given to you today.  *If you need a refill on your cardiac medications before your next appointment, please call your pharmacy*   Lab Work: None Ordered If you have labs (blood work) drawn today and your tests are completely normal, you will receive your results only by: MyChart Message (if you have MyChart) OR A paper copy in the mail If you have any lab test that is abnormal or we need to change your treatment, we will call you to review the results.   Testing/Procedures: None Ordered   Follow-Up: At [facility], you and your health needs are our priority.  As part of our continuing mission to provide you with exceptional heart care, we have created designated Provider Care Teams.  These Care Teams include your primary Cardiologist (physician) and Advanced Practice Providers (APPs -  Physician Assistants and Nurse Practitioners) who all work together to provide you with the care you need, when you need it.  We recommend signing up for the patient portal called "MyChart".  Sign up information is provided on this After Visit Summary.  MyChart is used to connect with patients for Virtual Visits (Telemedicine).  Patients are able to view lab/test results, encounter notes, upcoming appointments, etc.  Non-urgent messages can be sent to your provider as well.   To learn more about what you can do with MyChart, go to [email or web address].    Your next appointment:   Follow up as needed  The format for your next appointment:   In Person  Provider:   Robert Krasowski, MD    Other Instructions NA  

## 2022-01-25 NOTE — Progress Notes (Signed)
Cardiology Office Note:    Date:  01/25/2022   ID:  Melissa Marks, DOB Jan 25, 1961, MRN [id number]  PCP:  Annamaria Helling, MD  Cardiologist:  Gypsy Balsam, MD    Referring MD: Annamaria Helling, MD   Chief Complaint  Patient presents with   Leg Swelling    Several months ongoing    History of Present Illness:    Melissa Marks is a 61 y.o. female with past medical history significant for palpitations.  She was brought to [place] for evaluation of this problem she wore monitor monitor surprisingly shows some ventricular tachycardia 12 beats at rate of 166.  Stratification of the various arrhythmia included echocardiogram showing preserved left ventricle ejection fraction, since she had absolutely no signs and symptoms of coronary artery disease we elected to do coronary calcium score which was 0.  She comes today to my office for follow-up.  Overall she is doing well.  She denies have any chest pain tightness squeezing pressure burning chest no palpitations dizziness.  She does have swelling of left ankle this is some problem that been going on for long period of time denies having any issue with that.  Past Medical History:  Diagnosis Date   Familial hypercholesterolemia 03/31/2021   Fatigue 03/31/2021   Malaise and fatigue 03/31/2021   Menopausal syndrome 03/31/2021   Pure hypercholesterolemia 03/31/2021    Past Surgical History:  Procedure Laterality Date   TONSILECTOMY, ADENOIDECTOMY, BILATERAL MYRINGOTOMY AND TUBES      Current Medications: Current Meds  Medication Sig   calcium carbonate (TUMS - DOSED IN MG ELEMENTAL CALCIUM) 500 MG chewable tablet Chew 1 tablet by mouth every other day.   Multiple Vitamin (MULTIVITAMIN) tablet Take 1 tablet by mouth daily. Unknown strength     Allergies:   Patient has no known allergies.   Social History   Socioeconomic History   Marital status: Married    Spouse name: Not on file   Number of children: Not on file   Years of education: Not  on file   Highest education level: Not on file  Occupational History   Not on file  Tobacco Use   Smoking status: Never   Smokeless tobacco: Not on file  Substance and Sexual Activity   Alcohol use: Never   Drug use: Never   Sexual activity: Not on file  Other Topics Concern   Not on file  Social History Narrative   Not on file   Social Determinants of Health   Financial Resource Strain: Not on file  Food Insecurity: Not on file  Transportation Needs: Not on file  Physical Activity: Not on file  Stress: Not on file  Social Connections: Not on file     Family History: The patient's family history includes Aneurysm in her father; Heart disease in her father, maternal grandfather, and mother; Heart failure in her mother; Ovarian cancer in her mother. There is no history of Breast cancer. ROS:   Please see the history of present illness.    All 14 point review of systems negative except as described per history of present illness  EKGs/Labs/Other Studies Reviewed:      Recent Labs: No results found for requested labs within last 365 days.  Recent Lipid Panel No results found for: "CHOL", "TRIG", "HDL", "CHOLHDL", "VLDL", "LDLCALC", "LDLDIRECT"  Physical Exam:    VS:  BP 138/78 (BP Location: Left Arm, Patient Position: Sitting)   Pulse 63   Ht 5' 5.5" (1.664 m)  Wt 158 lb 9.6 oz (71.9 kg)   SpO2 98%   BMI 25.99 kg/m     Wt Readings from Last 3 Encounters:  01/25/22 158 lb 9.6 oz (71.9 kg)  07/16/21 156 lb 12.8 oz (71.1 kg)  04/03/21 160 lb 12.8 oz (72.9 kg)     GEN:  Well nourished, well developed in no acute distress HEENT: Normal NECK: No JVD; No carotid bruits LYMPHATICS: No lymphadenopathy CARDIAC: RRR, no murmurs, no rubs, no gallops RESPIRATORY:  Clear to auscultation without rales, wheezing or rhonchi  ABDOMEN: Soft, non-tender, non-distended MUSCULOSKELETAL:  No edema; No deformity  SKIN: Warm and dry LOWER EXTREMITIES: no swelling NEUROLOGIC:   Alert and oriented x 3 PSYCHIATRIC:  Normal affect   ASSESSMENT:    1. Ventricular tachycardia (HCC)   2. Palpitations   3. Dyspnea on exertion   4. Dyslipidemia    PLAN:    In order of problems listed above:  No sustained ventricular tachycardia completely asymptomatic.  Echocardiogram normal ejection fraction calcium score 0 overall low risk continue monitoring Palpitations denies having any Dyspnea exertion stable Dyslipidemia did review her K PN which show me her HDL 78 LDL 111 with her calcium score being 0 no need to treat with medications.  However, we did discuss healthy lifestyle which includes exercises as well as Mediterranean diet   Medication Adjustments/Labs and Tests Ordered: Current medicines are reviewed at length with the patient today.  Concerns regarding medicines are outlined above.  No orders of the defined types were placed in this encounter.  Medication changes: No orders of the defined types were placed in this encounter.   Signed, Georgeanna Lea, MD, [facility] 01/25/2022 3:10 PM    [facility]

## 2022-03-02 ENCOUNTER — Ambulatory Visit
Admission: RE | Admit: 2022-03-02 | Discharge: 2022-03-02 | Disposition: A | Discharge status: Home / Self Care | Payer: Federal, State, Local not specified - PPO | Source: Ambulatory Visit | Attending: Obstetrics & Gynecology | Admitting: Obstetrics & Gynecology

## 2022-03-02 DIAGNOSIS — Z1231 Encounter for screening mammogram for malignant neoplasm of breast: Secondary | ICD-10-CM

## 2022-12-20 ENCOUNTER — Other Ambulatory Visit: Payer: Self-pay | Admitting: Obstetrics & Gynecology

## 2022-12-20 DIAGNOSIS — Z1231 Encounter for screening mammogram for malignant neoplasm of breast: Secondary | ICD-10-CM

## 2023-03-04 ENCOUNTER — Ambulatory Visit
Admission: RE | Admit: 2023-03-04 | Discharge: 2023-03-04 | Disposition: A | Discharge status: Home / Self Care | Payer: Federal, State, Local not specified - PPO | Source: Ambulatory Visit | Attending: Obstetrics & Gynecology | Admitting: Obstetrics & Gynecology

## 2023-03-04 DIAGNOSIS — Z1231 Encounter for screening mammogram for malignant neoplasm of breast: Secondary | ICD-10-CM

## 2023-12-11 IMAGING — CT CT CARDIAC CORONARY ARTERY CALCIUM SCORE
3 series · 14 of 20 positions shown, 16 images · non-contrast
Comparison: None.
COMPARISON: None.

Addendum:
EXAM:
OVER-READ INTERPRETATION  CT CHEST

The following report is an over-read performed by radiologist Dr.
Deidre Vizcaino [REDACTED] on 09/04/2021. This over-read
does not include interpretation of cardiac or coronary anatomy or
pathology. The coronary calcium score interpretation by the
cardiologist is attached.
CLINICAL DATA: Risk stratification
Coronary Calcium Score
TECHNIQUE: The patient was scanned on a Siemens Force scanner. Axial
non-contrast 3 mm slices were carried out through the heart. The
data set was analyzed on a dedicated work station and scored using
the Agatson method.

[Series 2: cascseq 2.0 sa36 (id) (id) · axial · 0.39mm/px · z∈[-207,-127]mm · 4 of 68 slices shown]
[im 14/68  vessel]
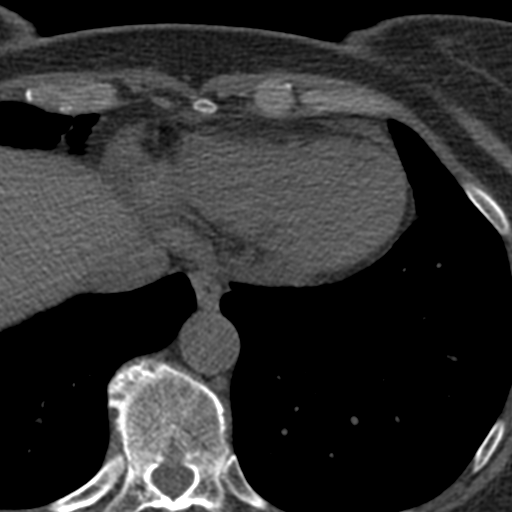
[im 27/68  vessel]
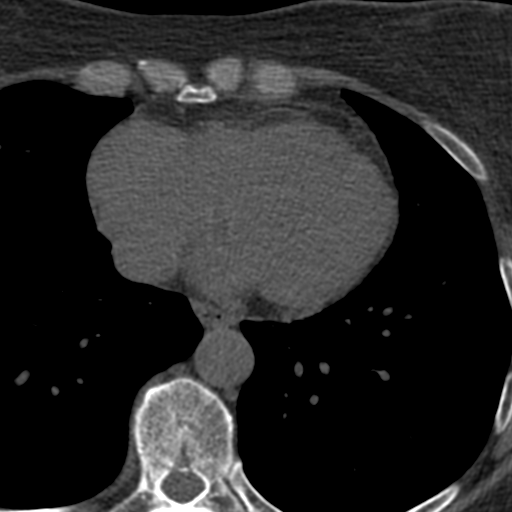
[im 41/68  vessel]
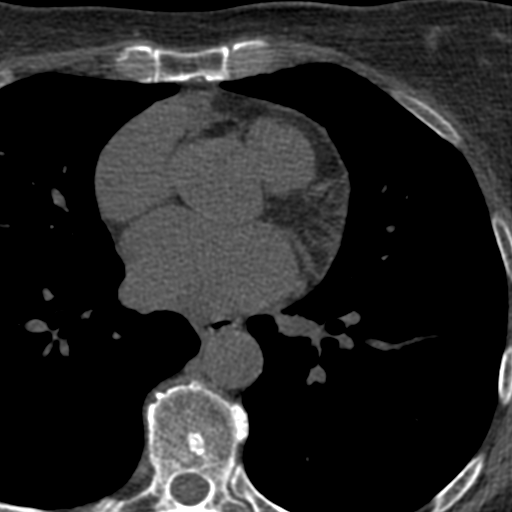
[im 54/68  vessel]
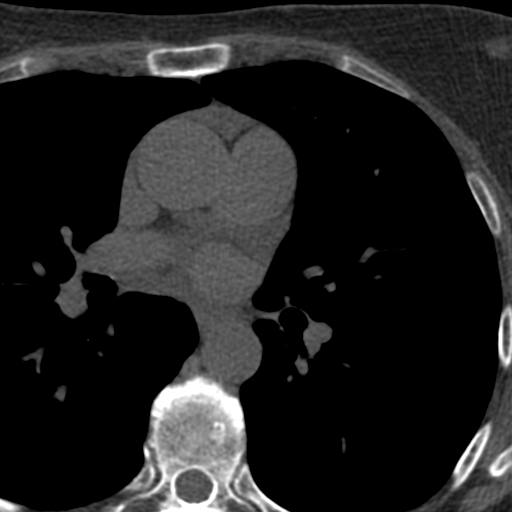

[Series 3: cascseq 2.0 bf37 st · axial · 0.65mm/px · z∈[-211,-123]mm · 5 of 68 slices shown, 7 images]
[im 12/68  vessel]
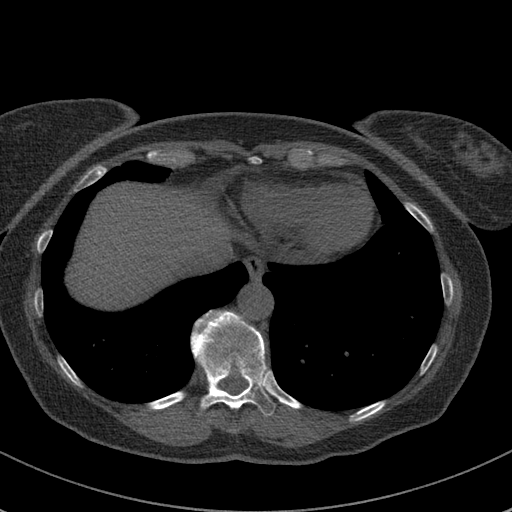
[im 12/68  lung]
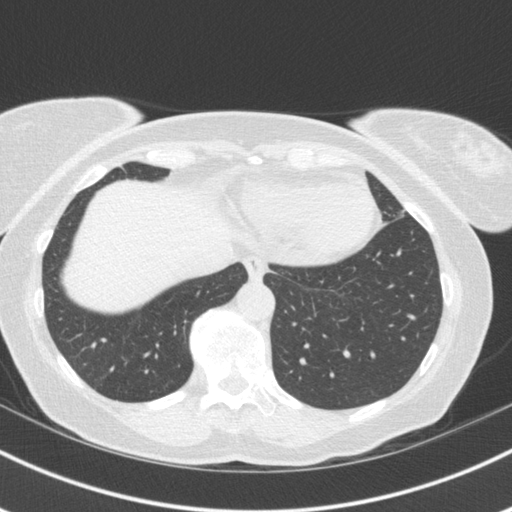
[im 23/68  vessel]
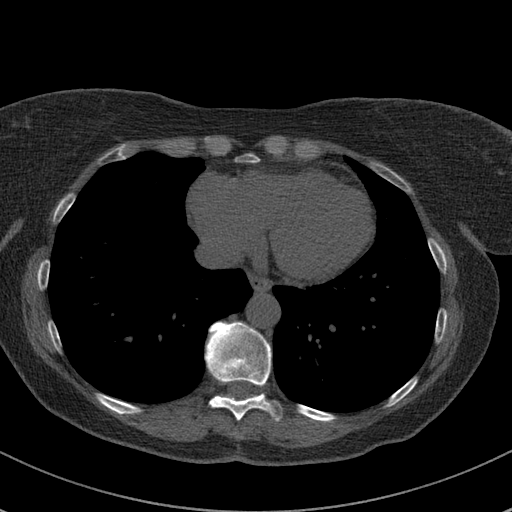
[im 34/68  vessel]
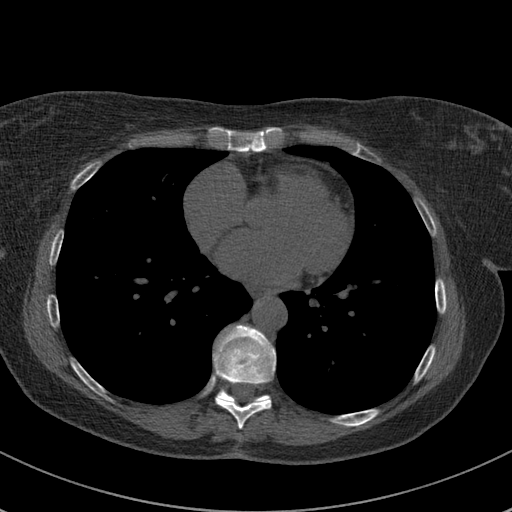
[im 45/68  vessel]
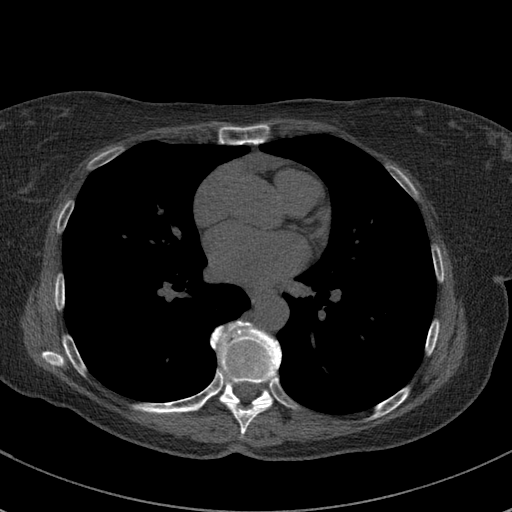
[im 56/68  vessel]
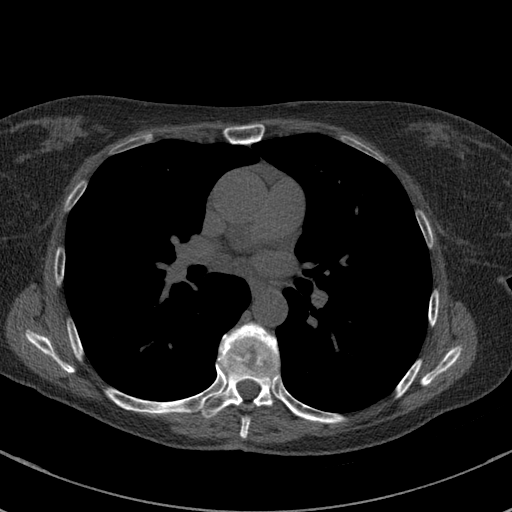
[im 56/68  lung]
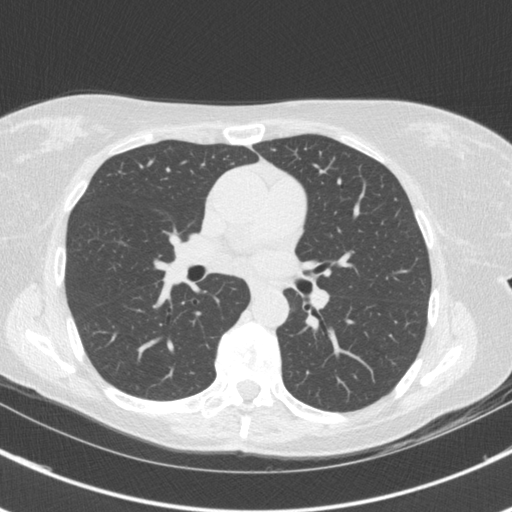

[Series 4: cascseq 2.0 br59 lung · axial · 0.65mm/px · z∈[-211,-123]mm · 5 of 68 slices shown]
[im 12/68  lung]
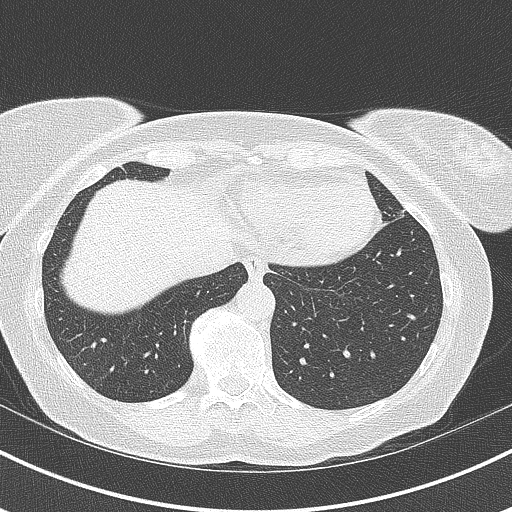
[im 23/68  lung]
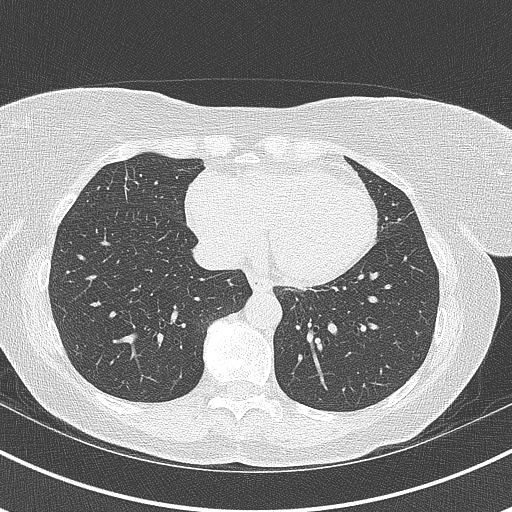
[im 34/68  lung]
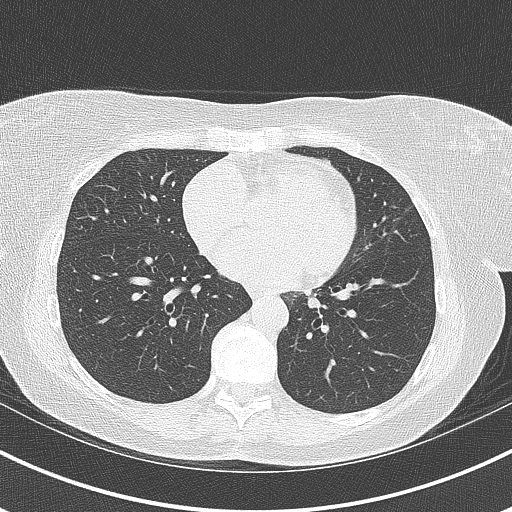
[im 45/68  lung]
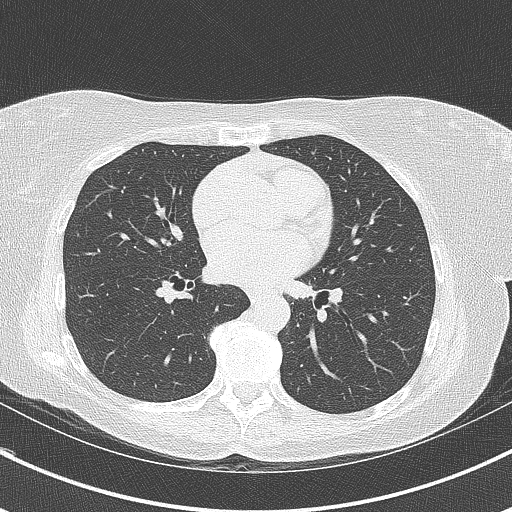
[im 56/68  lung]
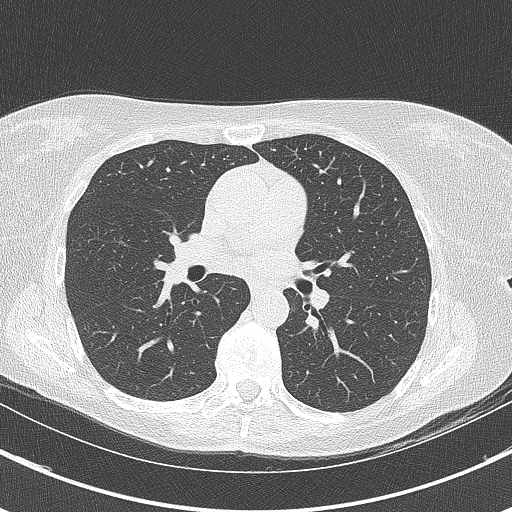

[14 of 20 positions shown; findings below may reference images not displayed]

FINDINGS: Vascular: Heart is normal size.  Aorta normal caliber.

Mediastinum/Nodes: No adenopathy

Lungs/Pleura: No confluent opacities or effusions.

Upper Abdomen: No acute findings

Musculoskeletal: Chest wall soft tissues are unremarkable. No acute
bony abnormality.
IMPRESSION: No acute extra cardiac abnormality.
FINDINGS: Non-cardiac: See separate report from [REDACTED].

Ascending Aorta: normal

Pericardium: Normal

Coronary arteries: Normal origin
IMPRESSION: Coronary calcium score of 0.

*** End of Addendum ***
EXAM:
OVER-READ INTERPRETATION  CT CHEST

The following report is an over-read performed by radiologist Dr.
Deidre Vizcaino [REDACTED] on 09/04/2021. This over-read
does not include interpretation of cardiac or coronary anatomy or
pathology. The coronary calcium score interpretation by the
cardiologist is attached.
FINDINGS: Vascular: Heart is normal size.  Aorta normal caliber.

Mediastinum/Nodes: No adenopathy

Lungs/Pleura: No confluent opacities or effusions.

Upper Abdomen: No acute findings

Musculoskeletal: Chest wall soft tissues are unremarkable. No acute
bony abnormality.
IMPRESSION: No acute extra cardiac abnormality.

## 2024-01-04 ENCOUNTER — Other Ambulatory Visit: Payer: Self-pay | Admitting: Obstetrics & Gynecology

## 2024-01-04 DIAGNOSIS — Z1231 Encounter for screening mammogram for malignant neoplasm of breast: Secondary | ICD-10-CM

## 2024-03-07 ENCOUNTER — Ambulatory Visit

## 2024-03-15 ENCOUNTER — Ambulatory Visit
Admission: RE | Admit: 2024-03-15 | Discharge: 2024-03-15 | Disposition: A | Source: Ambulatory Visit | Attending: Obstetrics & Gynecology | Admitting: Obstetrics & Gynecology

## 2024-03-15 DIAGNOSIS — Z1231 Encounter for screening mammogram for malignant neoplasm of breast: Secondary | ICD-10-CM

## 2024-03-20 ENCOUNTER — Other Ambulatory Visit: Payer: Self-pay | Admitting: Obstetrics & Gynecology

## 2024-03-20 DIAGNOSIS — R928 Other abnormal and inconclusive findings on diagnostic imaging of breast: Secondary | ICD-10-CM

## 2024-03-21 ENCOUNTER — Inpatient Hospital Stay
Admission: RE | Admit: 2024-03-21 | Discharge: 2024-03-21 | Attending: Obstetrics & Gynecology | Admitting: Obstetrics & Gynecology

## 2024-03-21 ENCOUNTER — Ambulatory Visit

## 2024-03-21 DIAGNOSIS — R928 Other abnormal and inconclusive findings on diagnostic imaging of breast: Secondary | ICD-10-CM
